# Patient Record
Sex: Female | Born: 1965 | Race: White | Hispanic: Yes | Marital: Married | Smoking: Never smoker
Health system: Southern US, Community
[De-identification: ages and names within clinical notes are randomized; demographics above are authoritative.]

## PROBLEM LIST (undated history)

## (undated) DIAGNOSIS — D259 Leiomyoma of uterus, unspecified: Secondary | ICD-10-CM

## (undated) DIAGNOSIS — D649 Anemia, unspecified: Secondary | ICD-10-CM

## (undated) HISTORY — DX: Anemia, unspecified: D64.9

## (undated) HISTORY — DX: Leiomyoma of uterus, unspecified: D25.9

---

## 2012-04-10 ENCOUNTER — Emergency Department (HOSPITAL_COMMUNITY)
Admission: EM | Admit: 2012-04-10 | Discharge: 2012-04-10 | Disposition: A | Discharge status: Home / Self Care | Payer: Self-pay | Attending: Emergency Medicine | Admitting: Emergency Medicine

## 2012-04-10 ENCOUNTER — Encounter (HOSPITAL_COMMUNITY): Payer: Self-pay | Admitting: Emergency Medicine

## 2012-04-10 DIAGNOSIS — IMO0002 Reserved for concepts with insufficient information to code with codable children: Secondary | ICD-10-CM | POA: Insufficient documentation

## 2012-04-10 DIAGNOSIS — T169XXA Foreign body in ear, unspecified ear, initial encounter: Secondary | ICD-10-CM | POA: Insufficient documentation

## 2012-04-10 NOTE — ED Provider Notes (Signed)
History     CSN: [id number]  Arrival date & time 04/10/12  0157   First MD Initiated Contact with Patient 04/10/12 [phone]      Chief Complaint  Patient presents with  . Otalgia    (Consider location/radiation/quality/duration/timing/severity/associated sxs/prior treatment) HPI Comments: Patient states she was sitting in a room.  When something in her left ear she has been feeling.  It moving.  Denies seeing any moths or insects prior to this incident  Patient is a 46 y.o. female presenting with ear pain. The history is provided by the patient.  Otalgia This is a new problem. There is pain in the left ear. The problem occurs constantly. Pertinent negatives include no headaches.    History reviewed. No pertinent past medical history.  History reviewed. No pertinent past surgical history.  No family history on file.  History  Substance Use Topics  . Smoking status: Never Smoker   . Smokeless tobacco: Not on file  . Alcohol Use: No    OB History    Grav Para Term Preterm Abortions TAB SAB Ect Mult Living                  Review of Systems  Constitutional: Negative for fever and chills.  HENT: Positive for ear pain.   Neurological: Negative for dizziness, weakness and headaches.    Allergies  Review of patient's allergies indicates no known allergies.  Home Medications  No current outpatient prescriptions on file.  BP 146/87  Temp 97.2 F (36.2 C) (Oral)  SpO2 98%  LMP 03/05/2012  Physical Exam  Constitutional: She appears well-developed and well-nourished.  HENT:  Head: Normocephalic.  Left Ear: A foreign body is present.       It appears there is a small, black, insect, but is not moving in the left ear, canal, FS.  The nurse to irrigate this out using a 50-50 solution of warm water and hydrogen peroxide  Eyes: Pupils are equal, round, and reactive to light.  Neck: Normal range of motion.  Cardiovascular: Normal rate.   Pulmonary/Chest: Effort normal.    Neurological: She is alert.  Skin: Skin is warm.    ED Course  Procedures (including critical care time)  Labs Reviewed - No data to display No results found.   No diagnosis found.    MDM   Small foreign, body/insect visualized within the left ear canal.  It does not appear to be moving at this time.  Will have the nurse irrigate this out with a 50-50 solution of warm water and hydrogen peroxide        Arman Filter, NP 04/16/12 [phone]

## 2012-04-10 NOTE — ED Notes (Signed)
NURSE FIRST ROUNDS : PT. SITTING WITH NO DISTRESS. NURSE EXPLAINED DELAY / PROCESS.

## 2012-04-10 NOTE — ED Notes (Signed)
PT. REPORTS INSECT IN HER LEFT EAR THIS EVENING , STATES SHE CAN FEEL IT MOVING .

## 2012-04-16 NOTE — ED Provider Notes (Signed)
Medical screening examination/treatment/procedure(s) were performed by non-physician practitioner and as supervising physician I was immediately available for consultation/collaboration.  Sunnie Nielsen, MD 04/16/12 2250

## 2013-07-16 ENCOUNTER — Emergency Department (HOSPITAL_COMMUNITY)
Admission: EM | Admit: 2013-07-16 | Discharge: 2013-07-16 | Disposition: A | Discharge status: Home / Self Care | Payer: Self-pay | Attending: Emergency Medicine | Admitting: Emergency Medicine

## 2013-07-16 ENCOUNTER — Encounter (HOSPITAL_COMMUNITY): Payer: Self-pay | Admitting: Emergency Medicine

## 2013-07-16 DIAGNOSIS — Z3202 Encounter for pregnancy test, result negative: Secondary | ICD-10-CM | POA: Insufficient documentation

## 2013-07-16 DIAGNOSIS — R109 Unspecified abdominal pain: Secondary | ICD-10-CM

## 2013-07-16 DIAGNOSIS — R1032 Left lower quadrant pain: Secondary | ICD-10-CM | POA: Insufficient documentation

## 2013-07-16 DIAGNOSIS — R110 Nausea: Secondary | ICD-10-CM | POA: Insufficient documentation

## 2013-07-16 LAB — CBC WITH DIFFERENTIAL/PLATELET
Eosinophils Relative: 0 % (ref 0–5)
HCT: 28.7 % — ABNORMAL LOW (ref 36.0–46.0)
Lymphs Abs: 1.1 10*3/uL (ref 0.7–4.0)
MCV: 69 fL — ABNORMAL LOW (ref 78.0–100.0)
Monocytes Relative: 8 % (ref 3–12)
Neutro Abs: 5.2 10*3/uL (ref 1.7–7.7)
RBC: 4.16 MIL/uL (ref 3.87–5.11)
WBC: 7 10*3/uL (ref 4.0–10.5)

## 2013-07-16 LAB — COMPREHENSIVE METABOLIC PANEL
Albumin: 3.6 g/dL (ref 3.5–5.2)
BUN: 13 mg/dL (ref 6–23)
Calcium: 8.7 mg/dL (ref 8.4–10.5)
Creatinine, Ser: 0.69 mg/dL (ref 0.50–1.10)
Total Protein: 8 g/dL (ref 6.0–8.3)

## 2013-07-16 LAB — URINALYSIS, ROUTINE W REFLEX MICROSCOPIC
Bilirubin Urine: NEGATIVE
Nitrite: NEGATIVE
Specific Gravity, Urine: 1.025 (ref 1.005–1.030)
pH: 6 (ref 5.0–8.0)

## 2013-07-16 LAB — URINE MICROSCOPIC-ADD ON

## 2013-07-16 LAB — POCT PREGNANCY, URINE: Preg Test, Ur: NEGATIVE

## 2013-07-16 MED ORDER — DICYCLOMINE HCL 20 MG PO TABS: 20.0000 mg | ORAL_TABLET | Freq: Two times a day (BID) | ORAL | Status: AC

## 2013-07-16 MED ORDER — PROMETHAZINE HCL 25 MG PO TABS: 25.0000 mg | ORAL_TABLET | Freq: Four times a day (QID) | ORAL | Status: AC | PRN

## 2013-07-16 MED ORDER — DICYCLOMINE HCL 10 MG/ML IM SOLN
20.0000 mg | Freq: Once | INTRAMUSCULAR | Status: AC
  Administered 2013-07-16: 20 mg via INTRAMUSCULAR
  Filled 2013-07-16: qty 2

## 2013-07-16 NOTE — ED Provider Notes (Signed)
CSN: [id number]     Arrival date & time 07/16/13  7829 History   First MD Initiated Contact with Patient 07/16/13 1026     Chief Complaint  Patient presents with  . Abdominal Pain   (Consider location/radiation/quality/duration/timing/severity/associated sxs/prior Treatment) HPI Comments: Patient presents today with a chief complaint of abdominal pain.  Pain located in the LLQ.  She reports that the pain has been present since this morning and has been intermittent.  Pain improved after given Advil.  Pain has improved from onset.  She reports some associated nausea, but no vomiting or diarrhea.  No urinary symptoms. Last BM was yesterday, which she reports was normal.  She reports that the pain was not associated with eating.  She denies fever or chills.  She states that she has never had pain like this before.  No prior abdominal surgeries.  Patient states that she is currently on her menstrual cycle.  The history is provided by the patient.    History reviewed. No pertinent past medical history. History reviewed. No pertinent past surgical history. History reviewed. No pertinent family history. History  Substance Use Topics  . Smoking status: Never Smoker   . Smokeless tobacco: Not on file  . Alcohol Use: No   OB History   Grav Para Term Preterm Abortions TAB SAB Ect Mult Living                 Review of Systems  Respiratory: Negative for shortness of breath.   Cardiovascular: Negative for chest pain.  Gastrointestinal: Positive for nausea and abdominal pain. Negative for vomiting, diarrhea, constipation, blood in stool and abdominal distention.  All other systems reviewed and are negative.    Allergies  Review of patient's allergies indicates no known allergies.  Home Medications  No current outpatient prescriptions on file. BP 142/91  Pulse 82  Temp(Src) 97.7 F (36.5 C) (Oral)  Resp 16  Ht 5' (1.524 m)  Wt 160 lb 4.8 oz (72.712 kg)  BMI 31.31 kg/m2  SpO2  97% Physical Exam  Nursing note and vitals reviewed. Constitutional: She appears well-developed and well-nourished.  HENT:  Head: Normocephalic and atraumatic.  Mouth/Throat: Oropharynx is clear and moist.  Neck: Normal range of motion. Neck supple.  Cardiovascular: Normal rate, regular rhythm and normal heart sounds.   Pulmonary/Chest: Effort normal and breath sounds normal.  Abdominal: Soft. Bowel sounds are normal. There is tenderness in the left lower quadrant. There is no rigidity, no rebound and no guarding.  Very mild tenderness to palpation of the LLQ.  No tenderness to palpation of the LUQ.  Neurological: She is alert.  Skin: Skin is warm and dry.  Psychiatric: She has a normal mood and affect.    ED Course  Procedures (including critical care time) Labs Review Labs Reviewed  LIPASE, BLOOD  COMPREHENSIVE METABOLIC PANEL  CBC WITH DIFFERENTIAL  URINALYSIS, ROUTINE W REFLEX MICROSCOPIC  POCT PREGNANCY, URINE   Imaging Review No results found.  EKG Interpretation   None      11:33 AM Reassessed patient.  She reports that her pain has completely resolved at this time. MDM  No diagnosis found. Patient presenting with pain of her LLQ.  Pain has been intermittent since this morning.  On exam very mild pain of the LLQ.  No rebound or guarding.  Patient is afebrile.  Labs unremarkable.  No vomiting or diarrhea.  Pain completely resolved after given Bentyl.  Therefore, feel that the patient is stable for discharge.  Patient in agreement with the plan.  Return precautions given.      Santiago Glad, PA-C 07/16/13 1212

## 2013-07-16 NOTE — ED Notes (Signed)
Pt reports she woke at 0600 today with severe LUQ pain radiating into her back. Reports she took advil with some relief but pain returned so she took some tylenol which did not help. States over past hour she has started to feel nausea as well

## 2013-07-16 NOTE — ED Notes (Signed)
Pt c/o LLQ abd pain that started this morning, reports initial onset was extremely painful and had nausea but no longer having nausea now, pain has decreased a lot. Took Advil PTA. Denies n/v/d. Denies burning sensation and urinary frequency. Nad, skin warm and dry, resp e/u.

## 2013-07-19 NOTE — ED Provider Notes (Signed)
  Medical screening examination/treatment/procedure(s) were performed by non-physician practitioner and as supervising physician I was immediately available for consultation/collaboration.     Gerhard Munch, MD 07/19/13 [phone]

## 2013-07-29 ENCOUNTER — Other Ambulatory Visit: Payer: Self-pay | Admitting: Specialist

## 2013-07-29 ENCOUNTER — Ambulatory Visit
Admission: RE | Admit: 2013-07-29 | Discharge: 2013-07-29 | Disposition: A | Discharge status: Home / Self Care | Payer: No Typology Code available for payment source | Source: Ambulatory Visit | Attending: Specialist | Admitting: Specialist

## 2013-07-29 DIAGNOSIS — R6889 Other general symptoms and signs: Secondary | ICD-10-CM

## 2014-03-05 ENCOUNTER — Other Ambulatory Visit (HOSPITAL_COMMUNITY)
Admission: RE | Admit: 2014-03-05 | Discharge: 2014-03-05 | Disposition: A | Discharge status: Home / Self Care | Payer: Self-pay | Source: Ambulatory Visit | Attending: Gynecology | Admitting: Gynecology

## 2014-03-05 ENCOUNTER — Ambulatory Visit (INDEPENDENT_AMBULATORY_CARE_PROVIDER_SITE_OTHER): Payer: Self-pay | Admitting: Gynecology

## 2014-03-05 ENCOUNTER — Encounter: Payer: Self-pay | Admitting: Gynecology

## 2014-03-05 VITALS — BP 122/80 | Ht 61.5 in | Wt 161.0 lb

## 2014-03-05 DIAGNOSIS — R143 Flatulence: Secondary | ICD-10-CM

## 2014-03-05 DIAGNOSIS — R102 Pelvic and perineal pain: Secondary | ICD-10-CM

## 2014-03-05 DIAGNOSIS — Z1151 Encounter for screening for human papillomavirus (HPV): Secondary | ICD-10-CM | POA: Insufficient documentation

## 2014-03-05 DIAGNOSIS — N949 Unspecified condition associated with female genital organs and menstrual cycle: Secondary | ICD-10-CM

## 2014-03-05 DIAGNOSIS — Z01419 Encounter for gynecological examination (general) (routine) without abnormal findings: Secondary | ICD-10-CM

## 2014-03-05 DIAGNOSIS — R142 Eructation: Secondary | ICD-10-CM

## 2014-03-05 DIAGNOSIS — R140 Abdominal distension (gaseous): Secondary | ICD-10-CM

## 2014-03-05 DIAGNOSIS — R141 Gas pain: Secondary | ICD-10-CM

## 2014-03-05 DIAGNOSIS — R1900 Intra-abdominal and pelvic swelling, mass and lump, unspecified site: Secondary | ICD-10-CM

## 2014-03-05 LAB — COMPREHENSIVE METABOLIC PANEL
ALBUMIN: 3.9 g/dL (ref 3.5–5.2)
ALK PHOS: 73 U/L (ref 39–117)
ALT: 15 U/L (ref 0–35)
AST: 15 U/L (ref 0–37)
BILIRUBIN TOTAL: 0.4 mg/dL (ref 0.2–1.2)
BUN: 11 mg/dL (ref 6–23)
CO2: 26 mEq/L (ref 19–32)
Calcium: 8.6 mg/dL (ref 8.4–10.5)
Chloride: 102 mEq/L (ref 96–112)
Creat: 0.46 mg/dL — ABNORMAL LOW (ref 0.50–1.10)
GLUCOSE: 91 mg/dL (ref 70–99)
POTASSIUM: 3.8 meq/L (ref 3.5–5.3)
Sodium: 136 mEq/L (ref 135–145)
Total Protein: 7.2 g/dL (ref 6.0–8.3)

## 2014-03-05 LAB — CBC WITH DIFFERENTIAL/PLATELET
BASOS PCT: 1 % (ref 0–1)
Basophils Absolute: 0.1 10*3/uL (ref 0.0–0.1)
EOS ABS: 0.1 10*3/uL (ref 0.0–0.7)
Eosinophils Relative: 1 % (ref 0–5)
HEMATOCRIT: 31 % — AB (ref 36.0–46.0)
Hemoglobin: 9.7 g/dL — ABNORMAL LOW (ref 12.0–15.0)
Lymphocytes Relative: 28 % (ref 12–46)
Lymphs Abs: 1.7 10*3/uL (ref 0.7–4.0)
MCH: 21 pg — AB (ref 26.0–34.0)
MCHC: 31.3 g/dL (ref 30.0–36.0)
MCV: 67 fL — ABNORMAL LOW (ref 78.0–100.0)
MONO ABS: 0.6 10*3/uL (ref 0.1–1.0)
Monocytes Relative: 10 % (ref 3–12)
Neutro Abs: 3.7 10*3/uL (ref 1.7–7.7)
Neutrophils Relative %: 60 % (ref 43–77)
Platelets: 375 10*3/uL (ref 150–400)
RBC: 4.63 MIL/uL (ref 3.87–5.11)
RDW: 17.4 % — ABNORMAL HIGH (ref 11.5–15.5)
WBC: 6.2 10*3/uL (ref 4.0–10.5)

## 2014-03-05 LAB — LIPID PANEL
Cholesterol: 152 mg/dL (ref 0–200)
HDL: 32 mg/dL — AB (ref >39)
LDL CALC: 82 mg/dL (ref 0–99)
TRIGLYCERIDES: 192 mg/dL — AB (ref <150)
Total CHOL/HDL Ratio: 4.8 Ratio
VLDL: 38 mg/dL (ref 0–40)

## 2014-03-05 LAB — TSH: TSH: 1.917 u[IU]/mL (ref 0.350–4.500)

## 2014-03-05 NOTE — Progress Notes (Signed)
Dana Guerrero 1966/06/06 [id number]   History:    48 y.o.  for annual gyn exam who is new to the practice. Patient has been complaining for the past several months of right lower double discomfort she attributed times right before her menses. She reports normal menstrual cycles lasting 5-7 days. She is not using any form of contraception. She is a gravida 6 para 6 all pregnancies were delivered vaginally. Patient stated that her last Pap smear was here in [facility] and was normal and she has no prior history of any abnormal Pap smear. She denies any GU or GI complaints. She has not had her baseline mammogram yet.  Past medical history,surgical history, family history and social history were all reviewed and documented in the EPIC chart.  Gynecologic History Patient's last menstrual period was 02/10/2014. Contraception: none Last Pap smear 3 years ago. Results were: normal Last mammogram: Not yet. Results were: Not yet  Obstetric History OB History  Gravida Para Term Preterm AB SAB TAB Ectopic Multiple Living  6 6        6     # Outcome Date GA Lbr Len/2nd Weight Sex Delivery Anes PTL Lv  6 PAR           5 PAR           4 PAR           3 PAR           2 PAR           1 PAR                ROS: A ROS was performed and pertinent positives and negatives are included in the history.  GENERAL: No fevers or chills. HEENT: No change in vision, no earache, sore throat or sinus congestion. NECK: No pain or stiffness. CARDIOVASCULAR: No chest pain or pressure. No palpitations. PULMONARY: No shortness of breath, cough or wheeze. GASTROINTESTINAL: Abdominal bloating and right lower quadrant pains GENITOURINARY: No urinary frequency, urgency, hesitancy or dysuria. MUSCULOSKELETAL: No joint or muscle pain, no back pain, no recent trauma. DERMATOLOGIC: No rash, no itching, no lesions. ENDOCRINE: No polyuria, polydipsia, no heat or cold intolerance. No recent change in weight.  HEMATOLOGICAL: No anemia or easy bruising or bleeding. NEUROLOGIC: No headache, seizures, numbness, tingling or weakness. PSYCHIATRIC: No depression, no loss of interest in normal activity or change in sleep pattern.     Exam: chaperone present  BP 122/80  Ht 5' 1.5" (1.562 m)  Wt 161 lb (73.029 kg)  BMI 29.93 kg/m2  LMP 02/10/2014  Body mass index is 29.93 kg/(m^2).  General appearance : Well developed well nourished female. No acute distress HEENT: Neck supple, trachea midline, no carotid bruits, no thyroidmegaly Lungs: Clear to auscultation, no rhonchi or wheezes, or rib retractions  Heart: Regular rate and rhythm, no murmurs or gallops Breast:Examined in sitting and supine position were symmetrical in appearance, no palpable masses or tenderness,  no skin retraction, no nipple inversion, no nipple discharge, no skin discoloration, no axillary or supraclavicular lymphadenopathy Abdomen: no palpable masses or tenderness, no rebound or guarding Extremities: no edema or skin discoloration or tenderness  Pelvic:  Bartholin, Urethra, Skene Glands: Within normal limits             Vagina: No gross lesions or discharge  Cervix: No gross lesions or discharge  Uterus  , enlarged approximately 12 week size  Adnexa: Difficult to examine due to patient's uterus size  Anus and perineum  normal   Rectovaginal  normal sphincter tone without palpated masses or tenderness             Hemoccult not done     Assessment/Plan:  48 y.o. female for annual exam with complaint of several months of right lower abdominal discomfort. During the exam she was found to have an enlarged pelvic mass possibly a fibroid uterus approximately 12-14 week size. It was nontender during exam. Patient will schedule an appointment for an ultrasound in the next few weeks. The following labs were ordered today: CBC, fasting lipid profile, comprehensive metabolic panel, TSH, urinalysis and Pap smear. A requisition  to schedule mammogram was provided as well all the above was discussed in Spanish and literature information provided in her native tongue as well  Note: This dictation was prepared with  Dragon/digital dictation along withSmart phrase technology. Any transcriptional errors that result from this process are unintentional.   Terrance Mass MD, 10:14 AM 03/05/2014

## 2014-03-05 NOTE — Patient Instructions (Signed)
Fibroma uterino (Uterine Fibroid) Un fibroma uterino es un crecimiento (tumor) dentro del tero. Este tipo de tumor no es Radio broadcast assistant y no se extiende fuera del tero. Podr tener uno o varios fibromas. Los fibromas pueden variar en tamao, peso y TEFL teacher en que se desarrollan dentro del tero. Algunos pueden llegar a ser bastante grandes. La mayora de los fibromas no necesitan tratamiento mdico, pero algunos pueden causar dolor o sangrado abundante durante los perodos y [place]. CAUSAS  Un fibroma es el resultado del desarrollo continuo de una nica clula uterina que sigue creciendo (no regulada) que es diferente al resto de las clulas del cuerpo humano. La mayora de las clulas tiene un mecanismo de control que evita que se reproduzcan de Research officer, trade union.  SIGNOS Y SNTOMAS   Hemorragias.  Dolor y sensacin de presin en la pelvis.  Problemas en la vejiga debido al tamao del fibroma.  Infertilidad y abortos espontneos, segn el tamao y la ubicacin del fibroma. DIAGNSTICO  Los fibromas uterinos se diagnostican con un examen fsico. El mdico puede palpar los tumores abultados al realizar el examen de la pelvis. Una ecografa puede indicarse para tener informacin del tamao, la ubicacin y el nmero de tumores.  TRATAMIENTO   El mdico puede considerar que es conveniente esperar y Barrister's clerk. Esto incluye el control del fibroma por parte del mdico para observar si crece o disminuye su tamao.  Podr indicarle un tratamiento hormonal o el uso de un dispositivo intrauterino (DIU).  En algunos casos es necesaria la ciruga para extirpar el fibroma (miomectoma) o el tero (histerectoma). Esto depender de su situacin. Cuando una mujer desea quedar embarazada y los fibromas interfieren en su fertilidad, el mdico puede recomendar la extirpacin del fibroma.  INSTRUCCIONES PARA EL CUIDADO EN EL HOGAR  Los cuidados en el hogar dependen del tratamiento que haya  recibido. En general:   Cumpla con todas las visitas de control, segn le indique su mdico.  Tome slo medicamentos de venta libre o recetados, segn las indicaciones del mdico. Si le recetaron un tratamiento hormonal, tome los medicamentos hormonales como le indicaron. No tome aspirina. Puede ocasionar hemorragias.  Consulte al mdico si debe tomar pldoras de hierro.  Si sus perodos son molestos pero no tan abundantes, acustese con los pies ligeramente elevados por encima del nivel del corazn. Coloque compresas fras en la zona inferior del abdomen.  Si sus perodos son muy abundantes, anote el nmero de compresas o tampones que [place] cada mes. Lleve esta informacin a su consulta mdica.  Incluya vegetales verdes en su dieta. SOLICITE ATENCIN MDICA DE INMEDIATO SI:  Siente dolor o clicos en la pelvis y no puede controlarlos con los medicamentos.  El dolor en la pelvis aumenta de manera repentina.  Aumenta el sangrado entre los perodos o [employer].  Si tiene perodos muy abundantes y debe cambiar un tampn o una toalla higinica cada media hora o menos.  Se siente mareado o tiene episodios de [place]. Document Released: 08/08/2005 Document Revised: 05/29/2013 [facility] Patient Information 2015 [place], [place]. This information is not intended to replace advice given to you by your health care provider. Make sure you discuss any questions you have with your health care provider. Ecografa transvaginal (Transvaginal Ultrasound) La ecografa transvaginal es una ecografa plvica en la que se utiliza una probeta metlica que se coloca en la vagina, para observar los rganos femeninos. El ecgrafo enva ondas sonoras desde un transductor (sonda). Estas ondas sonoras chocan contra las  estructuras del cuerpo (como un eco) y crean Proofreader. La imagen se observa en un monitor. Se denomina transvaginal debido a que la sonda se inserta dentro de la vagina. Puede haber una pequea  molestia por la introduccin de la sonda. Esta prueba tambin puede realizarse [employer]. La ecografa endovaginal es otro nombre para la ecografa transvaginal. En una ecografa transabdominal, la sonda se coloca en la parte externa del abdomen. Este mtodo no ofrece imgenes tan buenas como la tcnica transvaginal. La ecogafa transvaginal se utiliza para observar alteraciones en el tracto genital femenino. Entre ellos se incluyen:  Problemas de infertilidad.  Malformaciones congnitas (defecto de nacimiento) del tero y los ovarios.  Tumores en el tero.  Hemorragias anormales.  Tumores y quistes de ovario.  Abscesos (tejidos inflamados y pus) en la pelvis.  Dolor abdominal o plvico sin causa aparente.  Infecciones plvicas. DURANTE EL EMBARAZO, SE UTILIZA PAR OBSERVAR:  Embarazos normales.  Un embarazo ectpico (embarazo fuera del tero).  Latidos cardacos fetales.  Anormalidades de la pelvis que no se observan bien con la ecografa transabdominal.  Sospecha de gemelos o embarazo mltiple.  Aborto inminente.  Problemas en el cuello del tero (cuello incompetente, no permanece cerrado para contener al beb).  Cuando se realiza una amniocentesis (se retira lquido de la bolsa [place], para ser Bells).  Al buscar anormalidades en el beb.  Para controlar el crecimiento, el desarrollo y la edad del feto.  Para medir la cantidad de lquido en el saco amnitico.  Cuando se realiza una versin externa del beb (se lo mueve a Programmer, applications).  Evaluar al beb en embarazos de alto riesgo (perfil biofsico).  Si se sospecha el deceso del beb (muerte). En algunos casos, se utiliza un mtodo especial denominado ecografa con infusin salina, para una observacin ms precisa del tero. Se inyecta solucin salina (agua con sal) dentro del tero en pacientes no embarazadas para observar mejor su interior. Este mtodo no se Designer, fashion/clothing.  La probeta tambin puede usarse para obtener biopsias de Educational psychologist, para drenar lquido de quistes de ovario y para Designer, jewellery un DIU (dispositivo intrauterino para el control de la natalidad) que no pueda Justin. PREPARACIN PARA LA PRUEBA La ecografa transvaginal se realiza con la vejiga vaca. La ecografa transabdominal se realiza con la vejiga llena. Podrn solicitarle que beba varios vasos de agua antes del examen. En algunos casos se realiza una ecografa transabdominal antes de la ecografa transvaginal para obervar los rganos del abdomen. PROCEDIMIENTO  Deber acostarse en una cama, con las rodillas dobladas y los pies en los estribos. La probeta se cubre con un condn. Dentro de la vagina y en la probeta se aplica un lubricante estril. El lubricante ayuda a transmitir las ondas sonoras y Fish farm manager la irritacin de la vagina. El mdico mover la sonda en el interior de la cavidad vaginal para escanear las estructuras plvicas. Un examen normal mostrar una pelvis normal y contenidos normales en su interior. Una prueba anormal mostrar anormalidades en la pelvis, la placenta o el beb. LAS CAUSAS DE UN RESULTADO ANORMAL PUEDEN SER:  Crecimientos o tumores en:  El tero.  Los ovarios.  La vagina.  Otras estructuras plvicas.  Crecimientos no cancerosos en el tero y los ovarios.  El ovario se retuerce y se corta el suministro de [place] ([place]  Las reas de infeccin incluyen:  Enfermedad inflamatoria plvica.  Un absceso en la pelvis.  Ubicacin de un DIU. LOS PROBLEMAS QUE  PUEDEN HALLARSE EN UNA MUJER EMBARAZADA SON:  Embarazo ectpico (embarazo fuera del tero).  Embarazos mltiples.  Dilatacin (apertura) precoz anormal del cuello del tero. Esto puede indicar un cuello incompetente y Biomedical scientist.  Aborto inminente.  Muerte fetal.  Los problemas con la placenta incluyen:  La placenta se ha desarrollado sobre la abertura del cuello del tero  (placenta previa).  La placenta se ha separado anticipadamente en el tero (abrupcin placentaria).  La placenta se desarrolla en el msculo del tero (placenta acreta).  Tumores del Media planner, incluyendo la enfermedad trofoblstica gestacional. Se trata de un embarazo anormal en el que no hay feto. El tero se llena de quistes similares a uvas que en algunos casos son cancerosos.  Posicin incorrecta del feto (de nalgas, de vrtice).  Retraso del desarrollo fetal intrauterino (escaso desarrollo en el tero).  Anormalidades o infeccin fetal. RIESGOS Y COMPLICACIONES No hay riesgos conocidos para la ecografa. No se toman radiografas cuando se realiza una ecografa. Document Released: 11/24/2008 Document Revised: 10/31/2011 [facility] Patient Information 2015 [place]. This information is not intended to replace advice given to you by your health care provider. Make sure you discuss any questions you have with your health care provider.

## 2014-03-06 LAB — URINALYSIS W MICROSCOPIC + REFLEX CULTURE
Bilirubin Urine: NEGATIVE
CASTS: NONE SEEN
CRYSTALS: NONE SEEN
Glucose, UA: NEGATIVE mg/dL
KETONES UR: NEGATIVE mg/dL
Leukocytes, UA: NEGATIVE
Nitrite: NEGATIVE
PH: 5.5 (ref 5.0–8.0)
Protein, ur: NEGATIVE mg/dL
SPECIFIC GRAVITY, URINE: 1.029 (ref 1.005–1.030)
UROBILINOGEN UA: 0.2 mg/dL (ref 0.0–1.0)

## 2014-03-06 LAB — CYTOLOGY - PAP

## 2014-03-07 ENCOUNTER — Other Ambulatory Visit: Payer: Self-pay | Admitting: Gynecology

## 2014-03-07 MED ORDER — NITROFURANTOIN MONOHYD MACRO 100 MG PO CAPS: 100.0000 mg | ORAL_CAPSULE | Freq: Two times a day (BID) | ORAL | Status: AC

## 2014-03-08 LAB — URINE CULTURE: Colony Count: >=100000

## 2014-03-14 ENCOUNTER — Other Ambulatory Visit: Payer: Self-pay | Admitting: Gynecology

## 2014-03-14 DIAGNOSIS — R143 Flatulence: Secondary | ICD-10-CM

## 2014-03-14 DIAGNOSIS — R1031 Right lower quadrant pain: Secondary | ICD-10-CM

## 2014-03-14 DIAGNOSIS — R142 Eructation: Secondary | ICD-10-CM

## 2014-03-14 DIAGNOSIS — R141 Gas pain: Secondary | ICD-10-CM

## 2014-03-21 ENCOUNTER — Ambulatory Visit: Payer: Self-pay | Admitting: Gynecology

## 2014-03-21 ENCOUNTER — Ambulatory Visit: Payer: Self-pay

## 2014-04-04 ENCOUNTER — Ambulatory Visit (INDEPENDENT_AMBULATORY_CARE_PROVIDER_SITE_OTHER): Payer: Self-pay | Admitting: Gynecology

## 2014-04-04 ENCOUNTER — Ambulatory Visit (INDEPENDENT_AMBULATORY_CARE_PROVIDER_SITE_OTHER): Payer: Self-pay

## 2014-04-04 ENCOUNTER — Other Ambulatory Visit: Payer: Self-pay | Admitting: Gynecology

## 2014-04-04 ENCOUNTER — Encounter: Payer: Self-pay | Admitting: Gynecology

## 2014-04-04 DIAGNOSIS — Z30011 Encounter for initial prescription of contraceptive pills: Secondary | ICD-10-CM

## 2014-04-04 DIAGNOSIS — D259 Leiomyoma of uterus, unspecified: Secondary | ICD-10-CM

## 2014-04-04 DIAGNOSIS — N852 Hypertrophy of uterus: Secondary | ICD-10-CM

## 2014-04-04 DIAGNOSIS — R1031 Right lower quadrant pain: Secondary | ICD-10-CM

## 2014-04-04 DIAGNOSIS — R143 Flatulence: Secondary | ICD-10-CM

## 2014-04-04 DIAGNOSIS — R141 Gas pain: Secondary | ICD-10-CM

## 2014-04-04 DIAGNOSIS — N920 Excessive and frequent menstruation with regular cycle: Secondary | ICD-10-CM

## 2014-04-04 DIAGNOSIS — E785 Hyperlipidemia, unspecified: Secondary | ICD-10-CM

## 2014-04-04 DIAGNOSIS — R142 Eructation: Secondary | ICD-10-CM

## 2014-04-04 DIAGNOSIS — Z3009 Encounter for other general counseling and advice on contraception: Secondary | ICD-10-CM

## 2014-04-04 DIAGNOSIS — D252 Subserosal leiomyoma of uterus: Secondary | ICD-10-CM

## 2014-04-04 DIAGNOSIS — D509 Iron deficiency anemia, unspecified: Secondary | ICD-10-CM

## 2014-04-04 MED ORDER — NORETHINDRONE ACET-ETHINYL EST 1-20 MG-MCG PO TABS: 1.0000 | ORAL_TABLET | Freq: Every day | ORAL | Status: AC

## 2014-04-04 NOTE — Progress Notes (Signed)
   48 year old gravida 6 para 6 presented to the office today to discuss her ultrasound report as a result of her last office visit on July 15 whereby she had come for her annual exam. She was a new patient to the practice and had been complaining of right lower palmar discomfort especially right before menses. She has been using condoms for contraception and wanted to go on the oral contraceptive pill. She is a nonsmoker. Patient also at time of last office visit was noted to have hyperlipidemia and is currently on diet and exercise. Also her menstrual cycles are reported to last between 5-7 days the first 3 days are very heavy. The CBC indicated her hemoglobin was 9.7 and she has started iron supplementation.  Ultrasound today: Uterus measured 4.7 x 9.3 x 6.6 cm with endometrial stripe of 16.8. Patient had one fibroid measuring 10.2 x 7.1 x 9.3 cm a second fibroid measured 3.3 x 2.0 x 2.7 cm. Both ovaries normal. No fluid in the cul-de-sac.  Assessment/plan: #1 leiomyomatous uteri minimal symptoms present. She will return back in 6 months for followup ultrasound. I will provide her with literature information on fibroids as well as on hysterectomy in Spanish. #2 iron deficiency anemia attributed to her menorrhagia. Patient wishes to go on oral contraceptive pill. This would help with her cycle control as well as her cramps. She will be started on a 20 mcg oral contraceptive pills such as Junel 1/20. The risks benefits and pros and cons were discussed in Spanish. Patient denies any family history of a bleeding disorder. #3 hyperlipidemia patient currently working on diet and exercise. When she returns back in 6 months for followup ultrasound will check a fasting lipid profile along with a CBC.

## 2014-04-04 NOTE — Patient Instructions (Signed)
Histerectoma abdominal (Abdominal Hysterectomy) La histerectoma abdominal es un procedimiento quirrgico en el que se extirpa el tero. El tero es el rgano muscular donde se desarrolla el feto. Esta ciruga puede hacerse por muchos motivos. Puede necesitar una histerectoma abdominal si tiene cncer, tumores, dolor a largo plazo o hemorragia. Tambin pueden hacerle este procedimiento si el tero ha descendido hacia la vagina (prolapso uterino). Segn las causas por las que necesite una histerectoma abdominal, es posible que tambin le extirpen otros rganos del aparato reproductor. Estos podran incluir la parte de la vagina que se conecta con el tero (cuello del tero), los rganos que producen vulos (ovarios) y las trompas que [employer] ovarios con el tero (trompas de [place]). INFORME AL MDICO:   Cualquier alergia que tenga.  Todos los [employer], incluidos vitaminas, hierbas, gotas oftlmicas, cremas y medicamentos de venta libre.  Problemas previos que usted o los [employer] de su familia hayan tenido con el uso de anestsicos.  Enfermedades de la sangre que tenga.  Cirugas previas.  Enfermedades que tenga. RIESGOS Y COMPLICACIONES En general, se trata de un procedimiento seguro. Sin embargo, Engineer, technical sales, pueden surgir problemas. La infeccin es el problema ms frecuente despus de una histerectoma abdominal. Otros problemas posibles incluyen:  Hemorragias.  Formacin de cogulos sanguneos que pueden desprenderse y Sports administrator a los pulmones.  Lesin en otros rganos cercanos al tero.  Lesin en los nervios que causa neuralgia.  Menor inters sexual o [place]. ANTES DEL [place] abdominal es un procedimiento quirrgico mayor. Puede afectar la percepcin que tiene de usted [place]. Hable con el mdico [employer] fsicos y emocionales que puede causar la histerectoma.  Quiz deban  hacerle anlisis de [place] y radiografas antes de la [place].  Si fuma, deje de hacerlo. Pdale ayuda al mdico si est teniendo inconvenientes para dejar de fumar.  Deje de tomar medicamentos anticoagulantes segn las indicaciones del mdico.  Pueden indicarle que tome antibiticos o laxantes antes de la [place].  No coma ni beba nada durante las 6 a 8 horas previas a la [place].  Tome sus medicamentos habituales con un sorbito de [place].  Tome un bao de inmersin o una ducha la noche o la maana anterior al procedimiento. PROCEDIMIENTO  La histerectoma abdominal se hace en el quirfano del hospital.  En la mayora de los Springer, se le administrar un medicamento que la har dormir (anestesia general).  El cirujano har un corte (incisin) a travs de la piel en la parte inferior del abdomen.  La incisin puede tener de 5a 7pulgadas de [place]. Puede ser horizontal o vertical.  El cirujano apartar el tejido que recubre al tero. Luego, extraer con cuidado el tero junto con cualquier otro rgano del aparato reproductor que Media planner.  La hemorragia se controlar con pinzas o suturas.  El cirujano cerrar la incisin con suturas o clips metlicos. DESPUS DEL PROCEDIMIENTO  Sentir algo de dolor inmediatamente despus del procedimiento.  Le administrarn medicamentos para calmar el dolor cuando est en el rea de recuperacin.  La llevarn a la habitacin del hospital cuando se haya recuperado de la anestesia.  Es posible que [facility] durante 2a 5das.  Recibir instrucciones para recuperarse en su casa. Document Released: 08/13/2013 [facility] Patient Information 2015 [place]. This information is not intended to replace advice given to you by your health care provider. Make sure you discuss any questions you have with your health  care provider. Fibroma uterino (Uterine Fibroid) Un fibroma uterino es un crecimiento (tumor) dentro del  tero. Este tipo de tumor no es Radio broadcast assistant y no se extiende fuera del tero. Podr tener uno o varios fibromas. Los fibromas pueden variar en tamao, peso y TEFL teacher en que se desarrollan dentro del tero. Algunos pueden llegar a ser bastante grandes. La mayora de los fibromas no necesitan tratamiento mdico, pero algunos pueden causar dolor o sangrado abundante durante los perodos y [place]. CAUSAS  Un fibroma es el resultado del desarrollo continuo de una nica clula uterina que sigue creciendo (no regulada) que es diferente al resto de las clulas del cuerpo humano. La mayora de las clulas tiene un mecanismo de control que evita que se reproduzcan de Research officer, trade union.  SIGNOS Y SNTOMAS   Hemorragias.  Dolor y sensacin de presin en la pelvis.  Problemas en la vejiga debido al tamao del fibroma.  Infertilidad y abortos espontneos, segn el tamao y la ubicacin del fibroma. DIAGNSTICO  Los fibromas uterinos se diagnostican con un examen fsico. El mdico puede palpar los tumores abultados al realizar el examen de la pelvis. Una ecografa puede indicarse para tener informacin del tamao, la ubicacin y el nmero de tumores.  TRATAMIENTO   El mdico puede considerar que es conveniente esperar y Barrister's clerk. Esto incluye el control del fibroma por parte del mdico para observar si crece o disminuye su tamao.  Podr indicarle un tratamiento hormonal o el uso de un dispositivo intrauterino (DIU).  En algunos casos es necesaria la ciruga para extirpar el fibroma (miomectoma) o el tero (histerectoma). Esto depender de su situacin. Cuando una mujer desea quedar embarazada y los fibromas interfieren en su fertilidad, el mdico puede recomendar la extirpacin del fibroma.  INSTRUCCIONES PARA EL CUIDADO EN EL HOGAR  Los cuidados en el hogar dependen del tratamiento que haya recibido. En general:   Cumpla con todas las visitas de control, segn le indique su  mdico.  Tome slo medicamentos de venta libre o recetados, segn las indicaciones del mdico. Si le recetaron un tratamiento hormonal, tome los medicamentos hormonales como le indicaron. No tome aspirina. Puede ocasionar hemorragias.  Consulte al mdico si debe tomar pldoras de hierro.  Si sus perodos son molestos pero no tan abundantes, acustese con los pies ligeramente elevados por encima del nivel del corazn. Coloque compresas fras en la zona inferior del abdomen.  Si sus perodos son muy abundantes, anote el nmero de compresas o tampones que [place] cada mes. Lleve esta informacin a su consulta mdica.  Incluya vegetales verdes en su dieta. SOLICITE ATENCIN MDICA DE INMEDIATO SI:  Siente dolor o clicos en la pelvis y no puede controlarlos con los medicamentos.  El dolor en la pelvis aumenta de manera repentina.  Aumenta el sangrado entre los perodos o [employer].  Si tiene perodos muy abundantes y debe cambiar un tampn o una toalla higinica cada media hora o menos.  Se siente mareado o tiene episodios de [place]. Document Released: 08/08/2005 Document Revised: 05/29/2013 [facility] Patient Information 2015 [place], [place]. This information is not intended to replace advice given to you by your health care provider. Make sure you discuss any questions you have with your health care provider.

## 2014-06-23 ENCOUNTER — Encounter: Payer: Self-pay | Admitting: Gynecology

## 2015-05-20 IMAGING — CR DG CHEST 1V
1 series · 1 of 1 positions shown · non-contrast
Comparison: None.

CLINICAL DATA: Positive TB test

EXAM:
CHEST - 1 VIEW

[view not recorded]
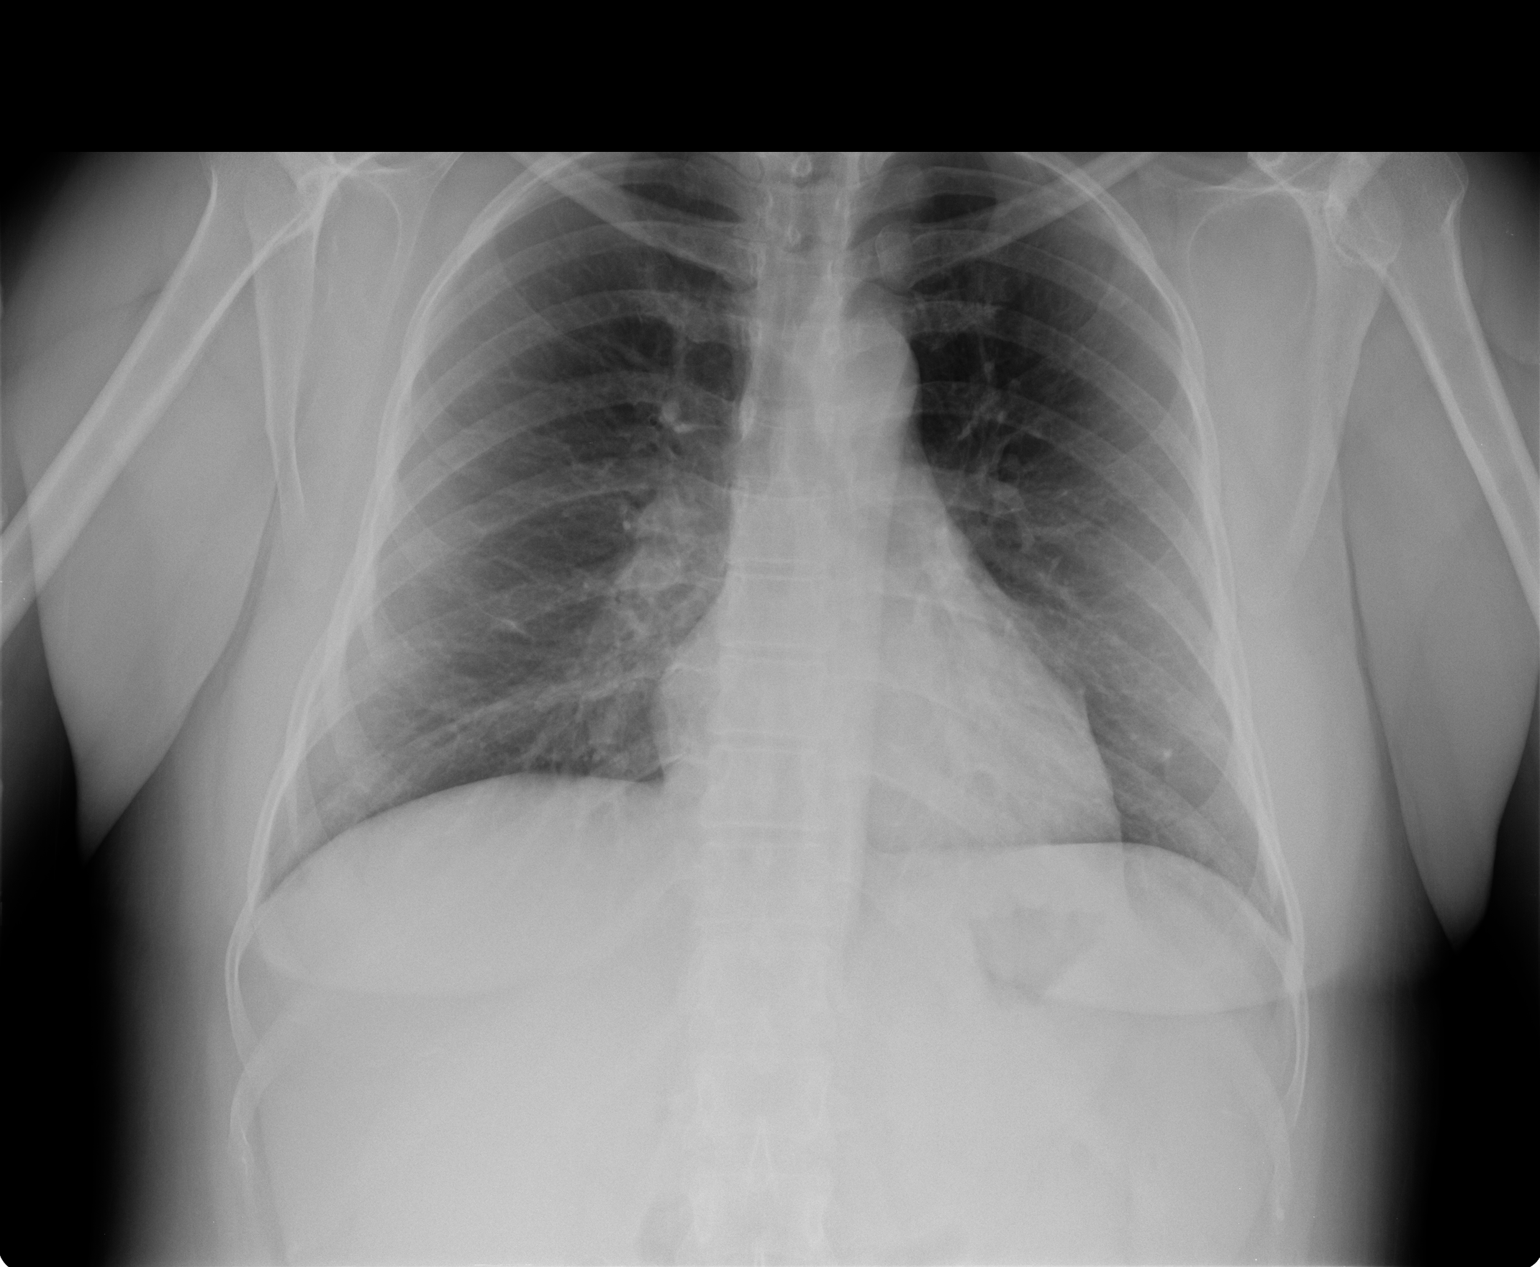

[1 of 1 positions shown; findings below may reference images not displayed]

FINDINGS: The heart size and mediastinal contours are within normal limits.
Both lungs are clear. The visualized skeletal structures are
unremarkable.
IMPRESSION: No active disease.

## 2016-01-22 ENCOUNTER — Encounter (HOSPITAL_COMMUNITY): Payer: Self-pay | Admitting: Emergency Medicine

## 2017-01-04 ENCOUNTER — Encounter: Payer: Self-pay | Admitting: Gynecology
